# Patient Record
Sex: Male | Born: 1945 | Hispanic: Yes | Marital: Married | Smoking: Never smoker
Health system: Southern US, Community
[De-identification: ages and names within clinical notes are randomized; demographics above are authoritative.]

## PROBLEM LIST (undated history)

## (undated) ENCOUNTER — Emergency Department (HOSPITAL_COMMUNITY): Discharge status: Absent without leave | Payer: BLUE CROSS/BLUE SHIELD

## (undated) DIAGNOSIS — N400 Enlarged prostate without lower urinary tract symptoms: Secondary | ICD-10-CM

## (undated) DIAGNOSIS — J45909 Unspecified asthma, uncomplicated: Secondary | ICD-10-CM

## (undated) DIAGNOSIS — K2970 Gastritis, unspecified, without bleeding: Secondary | ICD-10-CM

## (undated) DIAGNOSIS — E119 Type 2 diabetes mellitus without complications: Secondary | ICD-10-CM

## (undated) HISTORY — PX: HEMORRHOID SURGERY: SHX153

---

## 2006-01-13 ENCOUNTER — Ambulatory Visit: Payer: Self-pay | Admitting: Family Medicine

## 2007-12-23 ENCOUNTER — Ambulatory Visit: Payer: Self-pay | Admitting: Family Medicine

## 2008-06-03 ENCOUNTER — Emergency Department: Payer: Self-pay | Admitting: Emergency Medicine

## 2009-01-16 ENCOUNTER — Emergency Department: Payer: Self-pay | Admitting: Unknown Physician Specialty

## 2011-10-19 ENCOUNTER — Emergency Department: Payer: Self-pay | Admitting: Emergency Medicine

## 2011-10-19 LAB — URINALYSIS, COMPLETE
Bilirubin,UR: NEGATIVE
Blood: NEGATIVE
Nitrite: NEGATIVE
Squamous Epithelial: NONE SEEN

## 2014-10-03 ENCOUNTER — Emergency Department
Admission: EM | Admit: 2014-10-03 | Discharge: 2014-10-03 | Disposition: A | Discharge status: Home / Self Care | Payer: BLUE CROSS/BLUE SHIELD | Attending: Emergency Medicine | Admitting: Emergency Medicine

## 2014-10-03 ENCOUNTER — Encounter: Payer: Self-pay | Admitting: *Deleted

## 2014-10-03 DIAGNOSIS — Z79899 Other long term (current) drug therapy: Secondary | ICD-10-CM | POA: Diagnosis not present

## 2014-10-03 DIAGNOSIS — R059 Cough, unspecified: Secondary | ICD-10-CM

## 2014-10-03 DIAGNOSIS — R05 Cough: Secondary | ICD-10-CM

## 2014-10-03 DIAGNOSIS — J209 Acute bronchitis, unspecified: Secondary | ICD-10-CM | POA: Diagnosis not present

## 2014-10-03 MED ORDER — BENZONATATE 100 MG PO CAPS: 100.0000 mg | ORAL_CAPSULE | Freq: Three times a day (TID) | ORAL | Status: AC | PRN

## 2014-10-03 MED ORDER — FLUTICASONE PROPIONATE 50 MCG/ACT NA SUSP
1.0000 | Freq: Every day | NASAL | Status: AC
Start: 2014-10-03 — End: ?

## 2014-10-03 MED ORDER — AZITHROMYCIN 250 MG PO TABS: ORAL_TABLET | ORAL | Status: AC

## 2014-10-03 NOTE — ED Provider Notes (Signed)
[facility] Emergency Department Provider Note? ____________________________________________ ? Time seen: 1055 ? I have reviewed the triage vital signs and the nursing notes. ________ HISTORY ? Chief Complaint Cough  HPI  Angelia Mouldmado X Kohen is a 69 y.o. male who reports to the ED with complaints of continued cough and sore throat, following visit at the clinic on Wednesday. He was treated in the clinic Motrin but denies any improvement in his symptoms overall. She is here for evaluation and treatment he describes 9 out of 10 pain to the throat, denies any fevers, chills, sweats, nausea, vomiting or productive cough.  Review of Systems  Constitutional: Negative for fever. Eyes: Negative for visual changes. ENT: Positive for sore throat. Cardiovascular: Negative for chest pain. Respiratory: Negative for shortness of breath. Reports cough Gastrointestinal: Negative for abdominal pain, vomiting and diarrhea. Musculoskeletal: Negative for back pain. Skin: Negative for rash. Neurological: Negative for headaches, focal weakness or numbness.  10-point ROS otherwise negative. ____________________________________________  No past medical history on file.  There are no active problems to display for this patient.  ?No past surgical history on file. ? Current Outpatient Rx  Name  Route  Sig  Dispense  Refill  . levothyroxine (SYNTHROID, LEVOTHROID) 112 MCG tablet   Oral   Take 112 mcg by mouth daily before breakfast.         . metFORMIN (GLUCOPHAGE) 1000 MG tablet   Oral   Take 500 mg by mouth 2 (two) times daily with a meal.         . azithromycin (ZITHROMAX Z-PAK) 250 MG tablet      Take 2 tablets (500 mg) on  Day 1,  followed by 1 tablet (250 mg) once daily on Days 2 through 5.   6 each   0   . benzonatate (TESSALON PERLES) 100 MG capsule   Oral   Take 1 capsule (100 mg total) by mouth 3 (three) times daily as needed for cough (Take 1-2 per dose).    30 capsule   0   . fluticasone (FLONASE) 50 MCG/ACT nasal spray   Each Nare   Place 1 spray into both nostrils daily.   16 g   0   ? Allergies Review of patient's allergies indicates no known allergies. ? No family history on file. ? Social History History  Substance Use Topics  . Smoking status: Never Smoker   . Smokeless tobacco: Not on file  . Alcohol Use: No   PHYSICAL EXAM:  VITAL SIGNS: ED Triage Vitals  Enc Vitals Group     BP 10/03/14 0908 116/68 mmHg     Pulse Rate 10/03/14 0908 62     Resp 10/03/14 0908 18     Temp 10/03/14 0908 97.9 F (36.6 C)     Temp Source 10/03/14 0908 Oral     SpO2 10/03/14 0908 99 %     Weight 10/03/14 0908 164 lb (74.39 kg)     Height 10/03/14 0908 5\' 3"  (1.6 m)     Head Cir --      Peak Flow --      Pain Score 10/03/14 0916 9     Pain Loc --      Pain Edu? --      Excl. in GC? --    Constitutional: Alert and oriented. Well appearing and in no distress. Eyes: Conjunctivae are normal. PERRL. Normal extraocular movements. ENT   Head: Normocephalic and atraumatic.   Nose: No congestion/rhinnorhea.   Mouth/Throat:  Mucous membranes are moist.      Ears: Normal external exam. Canals clear. TMs clear bilaterally.   Neck: Supple. No lymphadenopathy. Cardiovascular: Normal rate, regular rhythm. Normal and symmetric distal pulses are present in all extremities. No murmurs, rubs, or gallops. Respiratory: Normal respiratory effort without tachypnea nor retractions. Breath sounds are clear and equal bilaterally. No wheezes/rales/rhonchi. Gastrointestinal: Soft and nontender.  Musculoskeletal: Normal range of motion in all extremities.  Neurologic:  Normal speech and language. No gross focal neurologic deficits are appreciated.  Skin:  Skin is warm, dry and intact. No rash noted. Psychiatric: Mood and affect are normal. Patient exhibits appropriate insight and judgment. ______ PROCEDURES ?  Procedure(s) performed:  None Critical Care performed: None ______________________________________________________ INITIAL IMPRESSION / ASSESSMENT AND PLAN / ED COURSE ? Pertinent labs & imaging results that were available during my care of the patient were reviewed by me and considered in my medical decision making (see chart for details).  ___________________________________________ FINAL CLINICAL IMPRESSION(S) / ED DIAGNOSES?  Final diagnoses:  Cough  Acute bronchitis, unspecified organism      Lissa HoardJenise V Bacon Adelena Desantiago, PA-C 10/03/14 1609  Darien Ramusavid W Kaminski, MD 10/04/14 1504

## 2014-10-03 NOTE — ED Notes (Signed)
Pt complains of cough and throat pain, pt was seen at the clinic Wednesday, pt reports not feeling any better, pt was given Motrin at clinic

## 2014-10-03 NOTE — Discharge Instructions (Signed)
Tos - Adultos  (Cough, Adult)  La tos es un reflejo que ayuda a limpiar las vas areas y Administratorla garganta. Puede ayudar a curar el organismo o ser Neomia Dearuna reaccin a un irritante. La tos puede durar General Electricentre 2  3 semanas (aguda) o puede durar ms de 8 semanas (crnica)  CAUSAS  Tos aguda:   Infecciones virales o bacterianas. Tos crnica.   Infecciones.  Alergias.  Asma.  Goteo post nasal.  El hbito de fumar.  Acidez o reflujo gstrico.  Algunos medicamentos.  Problemas pulmonares crnicos  Cncer. SNTOMAS   Tos.  Grant RutsFiebre.  Dolor en el pecho.  Aumento en el ritmo respiratorio.  Ruidos agudos al respirar (sibilancias).  Moco coloreado al toser (esputo). TRATAMIENTO   Un tos de causa bacteriana puede tratarse con antibiticos.  La tos de origen viral debe seguir su curso y no responde a los antibiticos.  El mdico podr recomendar otros tratamientos si tiene tos crnica. INSTRUCCIONES PARA EL CUIDADO EN EL HOGAR   Solo tome medicamentos que se pueden comprar sin receta o recetados para Chief Technology Officerel dolor, Dentistmalestar o fiebre, como le indica el mdico. Utilice antitusivos slo en la forma indicada por el mdico.  Use un vaporizador o humidificador de niebla fra en la habitacin para ayudar a aflojar las secreciones.  Duerma en posicin semi erguida si la tos empeora por la noche.  Descanse todo lo que pueda.  Si fuma, abandone el hbito. SOLICITE ATENCIN MDICA DE INMEDIATO SI:   Observa pus en el esputo.  La tos empeora.  No puede controlar la tos con antitusivos y no puede dormir debido a Secretary/administratorello.  Comienza a escupir sangre al toser.  Tiene dificultad para respirar.  El dolor empeora o no puede controlarlo con los medicamentos.  Tiene fiebre. ASEGRESE DE QUE:   Comprende estas instrucciones.  Controlar su enfermedad.  Solicitar ayuda de inmediato si no mejora o si empeora. Document Released: 12/17/2010 Document Revised: 08/03/2011 [facility] GroupExitCare Patient  Information 2015 [place]ExitCare, MarylandLLC. This information is not intended to replace advice given to you by your health care provider. Make sure you discuss any questions you have with your health care provider.  Bronquitis aguda (Acute Bronchitis) Se denomina bronquitis cuando las vas respiratorias que van desde la trquea The First Americanhasta los pulmones se irritan, se congestionan y duelen (se inflaman). La bronquitis generalmente produce flema espesa (mucosidad). Esto provoca tos. La tos es el sntoma ms frecuente de la bronquitis. Cuando la bronquitis es [place]aguda, generalmente comienza de [place]manera sbita y desaparece luego de algn tiempo (generalmente en 2 semanas). El hbito de fumar, las alergias y el asma pueden empeorar la bronquitis. Los episodios repetidos de bronquitis pueden causar ms problemas pulmonares. CUIDADOS EN EL HOGAR  Reposo.  Beba abundante cantidad de lquidos para mantener el pis orina) claro o amarillo plido (excepto que debe limitar la ingesta de lquidos por indicacin del mdico).  Tome slo medicamentos de venta libre o recetados, segn las indicaciones del mdico.  Evite fumar o aspirar el humo de otros fumadores. Esto puede empeorar la bronquitis. Si es fumador, considere el uso de chicles o parches en la piel de nicotina. Si deja de fumar, sus pulmones se curarn ms rpido.  Reduzca la probabilidad de enfermarse nuevamente de bronquitis de este modo:  Lvese las manos con frecuencia.  Evite las personas que tengan sntomas de resfro.  Trate de no llevarse las manos a la boca, la nariz o los ojos.  Concurra a las consultas de control con el  mdico, segn las indicaciones. SOLICITE AYUDA SI: Los sntomas no mejoran despus de 1 semana de tratamiento. Los sntomas son:  Leonette Mostos.  Grant RutsFiebre.  Eliminar moco espeso al toser.  Dolores PepsiCoen el cuerpo.  Congestin en el pecho.  Escalofros.  Falta de aire.  Dolor de Advertising copywritergarganta. SOLICITE AYUDA DE INMEDIATO SI:   Le sube la  fiebre.  Tiene escalofros.  Comienza a Surveyor, mineralssentir que le falta el aire de manera preocupante.  Tiene expectoracin con sangre (esputo).  Devuelve (vomita) con frecuencia.  Su organismo pierde mucho lquido (deshidratacin).  Sufre un dolor intenso de Turkmenistancabeza.  Se desmaya. ASEGRESE DE QUE:   Comprende estas instrucciones.  Controlar su afeccin.  Recibir ayuda de inmediato si no mejora o si empeora. Document Released: 01/11/2013 [facility] LLCExitCare Patient Information 2015 [place] VillageExitCare, MarylandLLC. This information is not intended to replace advice given to you by your health care provider. Make sure you discuss any questions you have with your health care provider.

## 2014-10-03 NOTE — ED Notes (Signed)
C/o cough, sorthroat

## 2016-06-02 ENCOUNTER — Other Ambulatory Visit: Payer: Self-pay | Admitting: Family Medicine

## 2016-06-02 DIAGNOSIS — G8929 Other chronic pain: Secondary | ICD-10-CM

## 2016-06-02 DIAGNOSIS — M545 Low back pain: Principal | ICD-10-CM

## 2016-06-09 ENCOUNTER — Ambulatory Visit: Admission: RE | Admit: 2016-06-09 | Payer: BLUE CROSS/BLUE SHIELD | Source: Ambulatory Visit

## 2017-05-17 ENCOUNTER — Other Ambulatory Visit: Payer: Self-pay | Admitting: Family Medicine

## 2017-05-17 DIAGNOSIS — R911 Solitary pulmonary nodule: Secondary | ICD-10-CM

## 2017-06-04 ENCOUNTER — Ambulatory Visit
Admission: RE | Admit: 2017-06-04 | Discharge: 2017-06-04 | Disposition: A | Discharge status: Home / Self Care | Payer: BLUE CROSS/BLUE SHIELD | Source: Ambulatory Visit | Attending: Family Medicine | Admitting: Family Medicine

## 2017-06-04 ENCOUNTER — Ambulatory Visit: Admission: RE | Admit: 2017-06-04 | Payer: BLUE CROSS/BLUE SHIELD | Source: Ambulatory Visit

## 2017-06-04 DIAGNOSIS — J984 Other disorders of lung: Secondary | ICD-10-CM | POA: Diagnosis not present

## 2017-06-04 DIAGNOSIS — R918 Other nonspecific abnormal finding of lung field: Secondary | ICD-10-CM | POA: Diagnosis not present

## 2017-06-04 DIAGNOSIS — R911 Solitary pulmonary nodule: Secondary | ICD-10-CM

## 2017-06-04 DIAGNOSIS — K7689 Other specified diseases of liver: Secondary | ICD-10-CM | POA: Insufficient documentation

## 2017-06-04 DIAGNOSIS — J8410 Pulmonary fibrosis, unspecified: Secondary | ICD-10-CM | POA: Diagnosis not present

## 2018-07-21 ENCOUNTER — Other Ambulatory Visit: Payer: Self-pay

## 2018-07-21 ENCOUNTER — Emergency Department
Admission: EM | Admit: 2018-07-21 | Discharge: 2018-07-21 | Disposition: A | Discharge status: Home / Self Care | Payer: Self-pay | Attending: Emergency Medicine | Admitting: Emergency Medicine

## 2018-07-21 ENCOUNTER — Encounter: Payer: Self-pay | Admitting: Emergency Medicine

## 2018-07-21 ENCOUNTER — Emergency Department: Payer: Self-pay

## 2018-07-21 DIAGNOSIS — Z79899 Other long term (current) drug therapy: Secondary | ICD-10-CM | POA: Insufficient documentation

## 2018-07-21 DIAGNOSIS — Z7984 Long term (current) use of oral hypoglycemic drugs: Secondary | ICD-10-CM | POA: Insufficient documentation

## 2018-07-21 DIAGNOSIS — E119 Type 2 diabetes mellitus without complications: Secondary | ICD-10-CM | POA: Insufficient documentation

## 2018-07-21 DIAGNOSIS — M25561 Pain in right knee: Secondary | ICD-10-CM | POA: Insufficient documentation

## 2018-07-21 HISTORY — DX: Type 2 diabetes mellitus without complications: E11.9
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
## 2018-07-21 NOTE — Discharge Instructions (Addendum)
Advised over-the-counter extra strength Tylenol and follow-up with PCP.
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
## 2018-10-19 ENCOUNTER — Emergency Department: Payer: Self-pay

## 2018-10-19 ENCOUNTER — Emergency Department
Admission: EM | Admit: 2018-10-19 | Discharge: 2018-10-19 | Disposition: A | Discharge status: Home / Self Care | Payer: Self-pay | Attending: Emergency Medicine | Admitting: Emergency Medicine

## 2018-10-19 ENCOUNTER — Other Ambulatory Visit: Payer: Self-pay

## 2018-10-19 DIAGNOSIS — Z79899 Other long term (current) drug therapy: Secondary | ICD-10-CM | POA: Insufficient documentation

## 2018-10-19 DIAGNOSIS — M25561 Pain in right knee: Secondary | ICD-10-CM | POA: Insufficient documentation

## 2018-10-19 DIAGNOSIS — Z7984 Long term (current) use of oral hypoglycemic drugs: Secondary | ICD-10-CM | POA: Insufficient documentation

## 2018-10-19 DIAGNOSIS — E119 Type 2 diabetes mellitus without complications: Secondary | ICD-10-CM | POA: Insufficient documentation

## 2018-10-19 DIAGNOSIS — F329 Major depressive disorder, single episode, unspecified: Secondary | ICD-10-CM | POA: Insufficient documentation

## 2018-10-19 MED ORDER — DICLOFENAC SODIUM 1 % TD GEL: 2.0000 g | Freq: Four times a day (QID) | TRANSDERMAL | 0 refills | Status: AC

## 2018-10-19 MED ORDER — MELOXICAM 7.5 MG PO TABS
7.5000 mg | ORAL_TABLET | Freq: Once | ORAL | Status: AC
  Administered 2018-10-19: 7.5 mg via ORAL
  Filled 2018-10-19: qty 1

## 2018-10-19 NOTE — Discharge Instructions (Addendum)
The x-ray of your knee shows some advanced arthritis.  Please use diclofenac gel to knee for pain.  Keep knee Ace wrap.  Call Dr. Rosita Kea tomorrow for an appointment for reevaluation.  Please establish with primary care.  I have given you a referral [facility] [place].  I have also given you a referral to a counseling service if you are having any trouble with depression.  Please return to the emergency department for worsening symptoms.

## 2018-10-19 NOTE — ED Triage Notes (Signed)
Pt states he injured his right knee at work in [place] and was seen here but is not getting any better. States he has not been able to get an apt for follow up
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
## 2020-02-12 ENCOUNTER — Encounter: Payer: Self-pay | Admitting: Emergency Medicine

## 2020-02-12 ENCOUNTER — Emergency Department: Payer: Self-pay

## 2020-02-12 ENCOUNTER — Other Ambulatory Visit: Payer: Self-pay

## 2020-02-12 ENCOUNTER — Emergency Department
Admission: EM | Admit: 2020-02-12 | Discharge: 2020-02-12 | Disposition: A | Discharge status: Home / Self Care | Payer: Self-pay | Attending: Emergency Medicine | Admitting: Emergency Medicine

## 2020-02-12 DIAGNOSIS — Z20822 Contact with and (suspected) exposure to covid-19: Secondary | ICD-10-CM | POA: Insufficient documentation

## 2020-02-12 DIAGNOSIS — E119 Type 2 diabetes mellitus without complications: Secondary | ICD-10-CM | POA: Insufficient documentation

## 2020-02-12 DIAGNOSIS — R0602 Shortness of breath: Secondary | ICD-10-CM | POA: Insufficient documentation

## 2020-02-12 DIAGNOSIS — J45909 Unspecified asthma, uncomplicated: Secondary | ICD-10-CM | POA: Insufficient documentation

## 2020-02-12 DIAGNOSIS — R0789 Other chest pain: Secondary | ICD-10-CM | POA: Insufficient documentation

## 2020-02-12 DIAGNOSIS — Z7951 Long term (current) use of inhaled steroids: Secondary | ICD-10-CM | POA: Insufficient documentation

## 2020-02-12 DIAGNOSIS — Z7984 Long term (current) use of oral hypoglycemic drugs: Secondary | ICD-10-CM | POA: Insufficient documentation

## 2020-02-12 DIAGNOSIS — R079 Chest pain, unspecified: Secondary | ICD-10-CM

## 2020-02-12 HISTORY — DX: Unspecified asthma, uncomplicated: J45.909

## 2020-02-12 HISTORY — DX: Benign prostatic hyperplasia without lower urinary tract symptoms: N40.0

## 2020-02-12 HISTORY — DX: Gastritis, unspecified, without bleeding: K29.70

## 2020-02-12 LAB — SARS CORONAVIRUS 2 BY RT PCR (HOSPITAL ORDER, PERFORMED IN ~~LOC~~ HOSPITAL LAB): SARS Coronavirus 2: NEGATIVE

## 2020-02-12 LAB — TROPONIN I (HIGH SENSITIVITY)
Troponin I (High Sensitivity): 3 ng/L (ref <18)
Troponin I (High Sensitivity): 3 ng/L (ref <18)

## 2020-02-12 LAB — BASIC METABOLIC PANEL
Anion gap: 8 (ref 5–15)
BUN: 23 mg/dL (ref 8–23)
CO2: 23 mmol/L (ref 22–32)
Calcium: 9.5 mg/dL (ref 8.9–10.3)
Chloride: 103 mmol/L (ref 98–111)
Creatinine, Ser: 0.72 mg/dL (ref 0.61–1.24)
GFR calc Af Amer: >60 mL/min (ref >60)
GFR calc non Af Amer: >60 mL/min (ref >60)
Glucose, Bld: 132 mg/dL — ABNORMAL HIGH (ref 70–99)
Potassium: 4 mmol/L (ref 3.5–5.1)
Sodium: 134 mmol/L — ABNORMAL LOW (ref 135–145)

## 2020-02-12 LAB — CBC
HCT: 42.4 % (ref 39.0–52.0)
Hemoglobin: 14 g/dL (ref 13.0–17.0)
MCH: 31.1 pg (ref 26.0–34.0)
MCHC: 33 g/dL (ref 30.0–36.0)
MCV: 94.2 fL (ref 80.0–100.0)
Platelets: 233 10*3/uL (ref 150–400)
RBC: 4.5 MIL/uL (ref 4.22–5.81)
RDW: 14.2 % (ref 11.5–15.5)
WBC: 6.3 10*3/uL (ref 4.0–10.5)
nRBC: 0 % (ref 0.0–0.2)

## 2020-02-12 NOTE — ED Provider Notes (Signed)
[facility] Emergency Department Provider Note   ____________________________________________   First MD Initiated Contact with Patient 02/12/20 1810     (approximate)  I have reviewed the triage vital signs and the nursing notes.   HISTORY  Chief Complaint Chest Pain    HPI Mason Gutierrez is a 74 y.o. male with possible history of diabetes and asthma who presents to the ED complaining of chest pain and shortness of breath.  History is limited as patient is Spanish-speaking only, history obtained via interpreter [phone].  Patient reports intermittent pressure in the left side of his chest along with some mild difficulty breathing over the past couple of days.  Symptoms are not exacerbated or alleviated by anything in particular, but patient states he feels like he does not have the energy to take a full breath.  He denies any fevers or cough, but has been dealing with a headache as well.  He denies any pain or swelling in his legs.  He is not aware of any sick contacts.  He currently reports minimal chest pain or shortness of breath.        Past Medical History:  Diagnosis Date  . Asthma   . BPH (benign prostatic hyperplasia)   . Diabetes mellitus without complication (HCC)   . Gastritis     There are no problems to display for this patient.   Past Surgical History:  Procedure Laterality Date  . HEMORRHOID SURGERY      Prior to Admission medications   Medication Sig Start Date End Date Taking? Authorizing Provider  azithromycin (ZITHROMAX Z-PAK) 250 MG tablet Take 2 tablets (500 mg) on  Day 1,  followed by 1 tablet (250 mg) once daily on Days 2 through 5. 10/03/14   Menshew, Charlesetta Ivory, PA-C  benzonatate (TESSALON PERLES) 100 MG capsule Take 1 capsule (100 mg total) by mouth 3 (three) times daily as needed for cough (Take 1-2 per dose). 10/03/14   Menshew, Charlesetta Ivory, PA-C  diclofenac sodium (VOLTAREN) 1 % GEL Apply 2 g topically 4 (four)  times daily. 10/19/18   Enid Derry, PA-C  fluticasone (FLONASE) 50 MCG/ACT nasal spray Place 1 spray into both nostrils daily. 10/03/14   Menshew, Charlesetta Ivory, PA-C  levothyroxine (SYNTHROID, LEVOTHROID) 112 MCG tablet Take 112 mcg by mouth daily before breakfast.    [provider]  metFORMIN (GLUCOPHAGE) 1000 MG tablet Take 500 mg by mouth 2 (two) times daily with a meal.    [provider]    Allergies Patient has no known allergies.  History reviewed. No pertinent family history.  Social History Social History   Tobacco Use  . Smoking status: Never Smoker  . Smokeless tobacco: Never Used  Substance Use Topics  . Alcohol use: No  . Drug use: No    Review of Systems  Constitutional: No fever/chills Eyes: No visual changes. ENT: No sore throat. Cardiovascular: Positive for chest pain. Respiratory: Positive for shortness of breath. Gastrointestinal: No abdominal pain.  No nausea, no vomiting.  No diarrhea.  No constipation. Genitourinary: Negative for dysuria. Musculoskeletal: Negative for back pain. Skin: Negative for rash. Neurological: Positive for headaches, negative for focal weakness or numbness.  ____________________________________________   PHYSICAL EXAM:  VITAL SIGNS: ED Triage Vitals  Enc Vitals Group     BP 02/12/20 1422 120/68     Pulse Rate 02/12/20 1422 63     Resp 02/12/20 1422 16     Temp 02/12/20 1431  97.9 F (36.6 C)     Temp Source 02/12/20 1431 Oral     SpO2 02/12/20 1422 98 %     Weight 02/12/20 1425 166 lb (75.3 kg)     Height 02/12/20 1425 5' 5.35" (1.66 m)     Head Circumference --      Peak Flow --      Pain Score 02/12/20 1423 7     Pain Loc --      Pain Edu? --      Excl. in GC? --     Constitutional: Alert and oriented. Eyes: Conjunctivae are normal. Head: Atraumatic. Nose: No congestion/rhinnorhea. Mouth/Throat: Mucous membranes are moist. Neck: Normal ROM Cardiovascular: Normal rate, regular  rhythm. Grossly normal heart sounds. Respiratory: Normal respiratory effort.  No retractions. Lungs CTAB. Gastrointestinal: Soft and nontender. No distention. Genitourinary: deferred Musculoskeletal: No lower extremity tenderness nor edema. Neurologic:  Normal speech and language. No gross focal neurologic deficits are appreciated. Skin:  Skin is warm, dry and intact. No rash noted. Psychiatric: Mood and affect are normal. Speech and behavior are normal.  ____________________________________________   LABS (all labs ordered are listed, but only abnormal results are displayed)  Labs Reviewed  BASIC METABOLIC PANEL - Abnormal; Notable for the following components:      Result Value   Sodium 134 (*)    Glucose, Bld 132 (*)    All other components within normal limits  SARS CORONAVIRUS 2 BY RT PCR (HOSPITAL ORDER, PERFORMED IN [place] [facility])  CBC  TROPONIN I (HIGH SENSITIVITY)  TROPONIN I (HIGH SENSITIVITY)   ____________________________________________  EKG  ED ECG REPORT I, Chesley Noon, the attending physician, personally viewed and interpreted this ECG.   Date: 02/12/2020  EKG Time: 14:21  Rate: 58  Rhythm: sinus bradycardia  Axis: Normal  Intervals:none  ST&T Change: None   PROCEDURES  Procedure(s) performed (including Critical Care):  Procedures   ____________________________________________   INITIAL IMPRESSION / ASSESSMENT AND PLAN / ED COURSE       74 year old male with past medical history of diabetes and asthma who presents to the ED complaining of intermittent chest pain and shortness of breath over the past few days.  Low suspicion for ACS as EKG is unremarkable and initial troponin negative.  We will repeat second set troponin, but if this is negative I doubt ACS and he would be appropriate for discharge given heart score of less than 4.  Chest x-ray is unremarkable and I have a low suspicion for PE or dissection.  Given his associated  headache, viral illness is possible and we will test for COVID-19.  Do not suspect intracranial process given gradually worsening headache with no focal neurologic deficits on exam.  Repeat troponin within normal limits and patient remains chest pain-free here in the ED.  He is appropriate for discharge home and was given referral to follow-up with cardiology.  He was counseled to return to the ED for new worsening symptoms, patient agrees with plan.      ____________________________________________   FINAL CLINICAL IMPRESSION(S) / ED DIAGNOSES  Final diagnoses:  Nonspecific chest pain     ED Discharge Orders    None       Note:  This document was prepared using Dragon voice recognition software and may include unintentional dictation errors.   Chesley Noon, MD 02/12/20 2026
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
## 2020-07-29 ENCOUNTER — Emergency Department: Payer: Self-pay

## 2020-07-29 ENCOUNTER — Other Ambulatory Visit: Payer: Self-pay

## 2020-07-29 ENCOUNTER — Emergency Department
Admission: EM | Admit: 2020-07-29 | Discharge: 2020-07-30 | Disposition: A | Discharge status: Home / Self Care | Payer: Self-pay | Attending: Emergency Medicine | Admitting: Emergency Medicine

## 2020-07-29 ENCOUNTER — Encounter: Payer: Self-pay | Admitting: Radiology

## 2020-07-29 DIAGNOSIS — E119 Type 2 diabetes mellitus without complications: Secondary | ICD-10-CM | POA: Insufficient documentation

## 2020-07-29 DIAGNOSIS — Y92410 Unspecified street and highway as the place of occurrence of the external cause: Secondary | ICD-10-CM | POA: Diagnosis not present

## 2020-07-29 DIAGNOSIS — M25512 Pain in left shoulder: Secondary | ICD-10-CM | POA: Diagnosis not present

## 2020-07-29 DIAGNOSIS — J45909 Unspecified asthma, uncomplicated: Secondary | ICD-10-CM | POA: Insufficient documentation

## 2020-07-29 DIAGNOSIS — Z7984 Long term (current) use of oral hypoglycemic drugs: Secondary | ICD-10-CM | POA: Insufficient documentation

## 2020-07-29 DIAGNOSIS — F32A Depression, unspecified: Secondary | ICD-10-CM

## 2020-07-29 LAB — URINE DRUG SCREEN, QUALITATIVE (ARMC ONLY)
Amphetamines, Ur Screen: NOT DETECTED
Barbiturates, Ur Screen: NOT DETECTED
Benzodiazepine, Ur Scrn: NOT DETECTED
Cannabinoid 50 Ng, Ur ~~LOC~~: NOT DETECTED
Cocaine Metabolite,Ur ~~LOC~~: NOT DETECTED
MDMA (Ecstasy)Ur Screen: NOT DETECTED
Methadone Scn, Ur: NOT DETECTED
Opiate, Ur Screen: NOT DETECTED
Phencyclidine (PCP) Ur S: NOT DETECTED
Tricyclic, Ur Screen: NOT DETECTED

## 2020-07-29 LAB — CBC WITH DIFFERENTIAL/PLATELET
Abs Immature Granulocytes: 0.01 10*3/uL (ref 0.00–0.07)
Basophils Absolute: 0.1 10*3/uL (ref 0.0–0.1)
Basophils Relative: 1 %
Eosinophils Absolute: 2 10*3/uL — ABNORMAL HIGH (ref 0.0–0.5)
Eosinophils Relative: 27 %
HCT: 41.2 % (ref 39.0–52.0)
Hemoglobin: 13.6 g/dL (ref 13.0–17.0)
Immature Granulocytes: 0 %
Lymphocytes Relative: 35 %
Lymphs Abs: 2.7 10*3/uL (ref 0.7–4.0)
MCH: 30.2 pg (ref 26.0–34.0)
MCHC: 33 g/dL (ref 30.0–36.0)
MCV: 91.6 fL (ref 80.0–100.0)
Monocytes Absolute: 0.7 10*3/uL (ref 0.1–1.0)
Monocytes Relative: 9 %
Neutro Abs: 2.1 10*3/uL (ref 1.7–7.7)
Neutrophils Relative %: 28 %
Platelets: 232 10*3/uL (ref 150–400)
RBC: 4.5 MIL/uL (ref 4.22–5.81)
RDW: 12.7 % (ref 11.5–15.5)
WBC: 7.6 10*3/uL (ref 4.0–10.5)
nRBC: 0 % (ref 0.0–0.2)

## 2020-07-29 LAB — SALICYLATE LEVEL: Salicylate Lvl: <7 mg/dL — ABNORMAL LOW (ref 7.0–30.0)

## 2020-07-29 LAB — URINALYSIS, COMPLETE (UACMP) WITH MICROSCOPIC
Bacteria, UA: NONE SEEN
Bilirubin Urine: NEGATIVE
Glucose, UA: NEGATIVE mg/dL
Hgb urine dipstick: NEGATIVE
Ketones, ur: NEGATIVE mg/dL
Leukocytes,Ua: NEGATIVE
Nitrite: NEGATIVE
Protein, ur: NEGATIVE mg/dL
Specific Gravity, Urine: 1.027 (ref 1.005–1.030)
pH: 5 (ref 5.0–8.0)

## 2020-07-29 LAB — COMPREHENSIVE METABOLIC PANEL
ALT: 19 U/L (ref 0–44)
AST: 20 U/L (ref 15–41)
Albumin: 4.2 g/dL (ref 3.5–5.0)
Alkaline Phosphatase: 60 U/L (ref 38–126)
Anion gap: 7 (ref 5–15)
BUN: 19 mg/dL (ref 8–23)
CO2: 25 mmol/L (ref 22–32)
Calcium: 9.4 mg/dL (ref 8.9–10.3)
Chloride: 102 mmol/L (ref 98–111)
Creatinine, Ser: 0.8 mg/dL (ref 0.61–1.24)
GFR, Estimated: >60 mL/min (ref >60)
Glucose, Bld: 181 mg/dL — ABNORMAL HIGH (ref 70–99)
Potassium: 4 mmol/L (ref 3.5–5.1)
Sodium: 134 mmol/L — ABNORMAL LOW (ref 135–145)
Total Bilirubin: 0.8 mg/dL (ref 0.3–1.2)
Total Protein: 7.4 g/dL (ref 6.5–8.1)

## 2020-07-29 LAB — ETHANOL: Alcohol, Ethyl (B): <10 mg/dL (ref <10)

## 2020-07-29 LAB — ACETAMINOPHEN LEVEL: Acetaminophen (Tylenol), Serum: <10 ug/mL — ABNORMAL LOW (ref 10–30)

## 2020-07-29 NOTE — ED Notes (Signed)
Belongings:White men's t shirt, white striped button down shirt, brown leather belt, gray pants, brown shoes, 1 pair, blue baseball cap, grocery bag with papers, camo wallet, small black notebook, loose change, car keys, gray sweatpants, gray socks, blue briefs,  All belongings placed in bag and labeled with pt's information.  Pt states,through interpreter, that he does not want to hurt himself or others but often wants to cry and feels desperate. Pt informed of procedure to move to room 20 in quad.  Pt dressed in blue paper scrubs, white stretchy briefs and nonskid socks.
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
## 2020-07-30 DIAGNOSIS — F32A Depression, unspecified: Secondary | ICD-10-CM
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
## 2020-07-30 NOTE — ED Notes (Signed)
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
## 2020-07-30 NOTE — ED Notes (Signed)
Psych NP and TTS at bedside 

## 2022-05-23 IMAGING — CR DG CHEST 2V
1 series · 2 of 2 positions shown · non-contrast
Comparison: January 13, 2006

CLINICAL DATA: Chest pain and shortness of breath

EXAM:
CHEST - 2 VIEW

[Series 1: dg chest 2 view · 0.14mm/px · 2 of 2 slices shown]
[im 1/2]
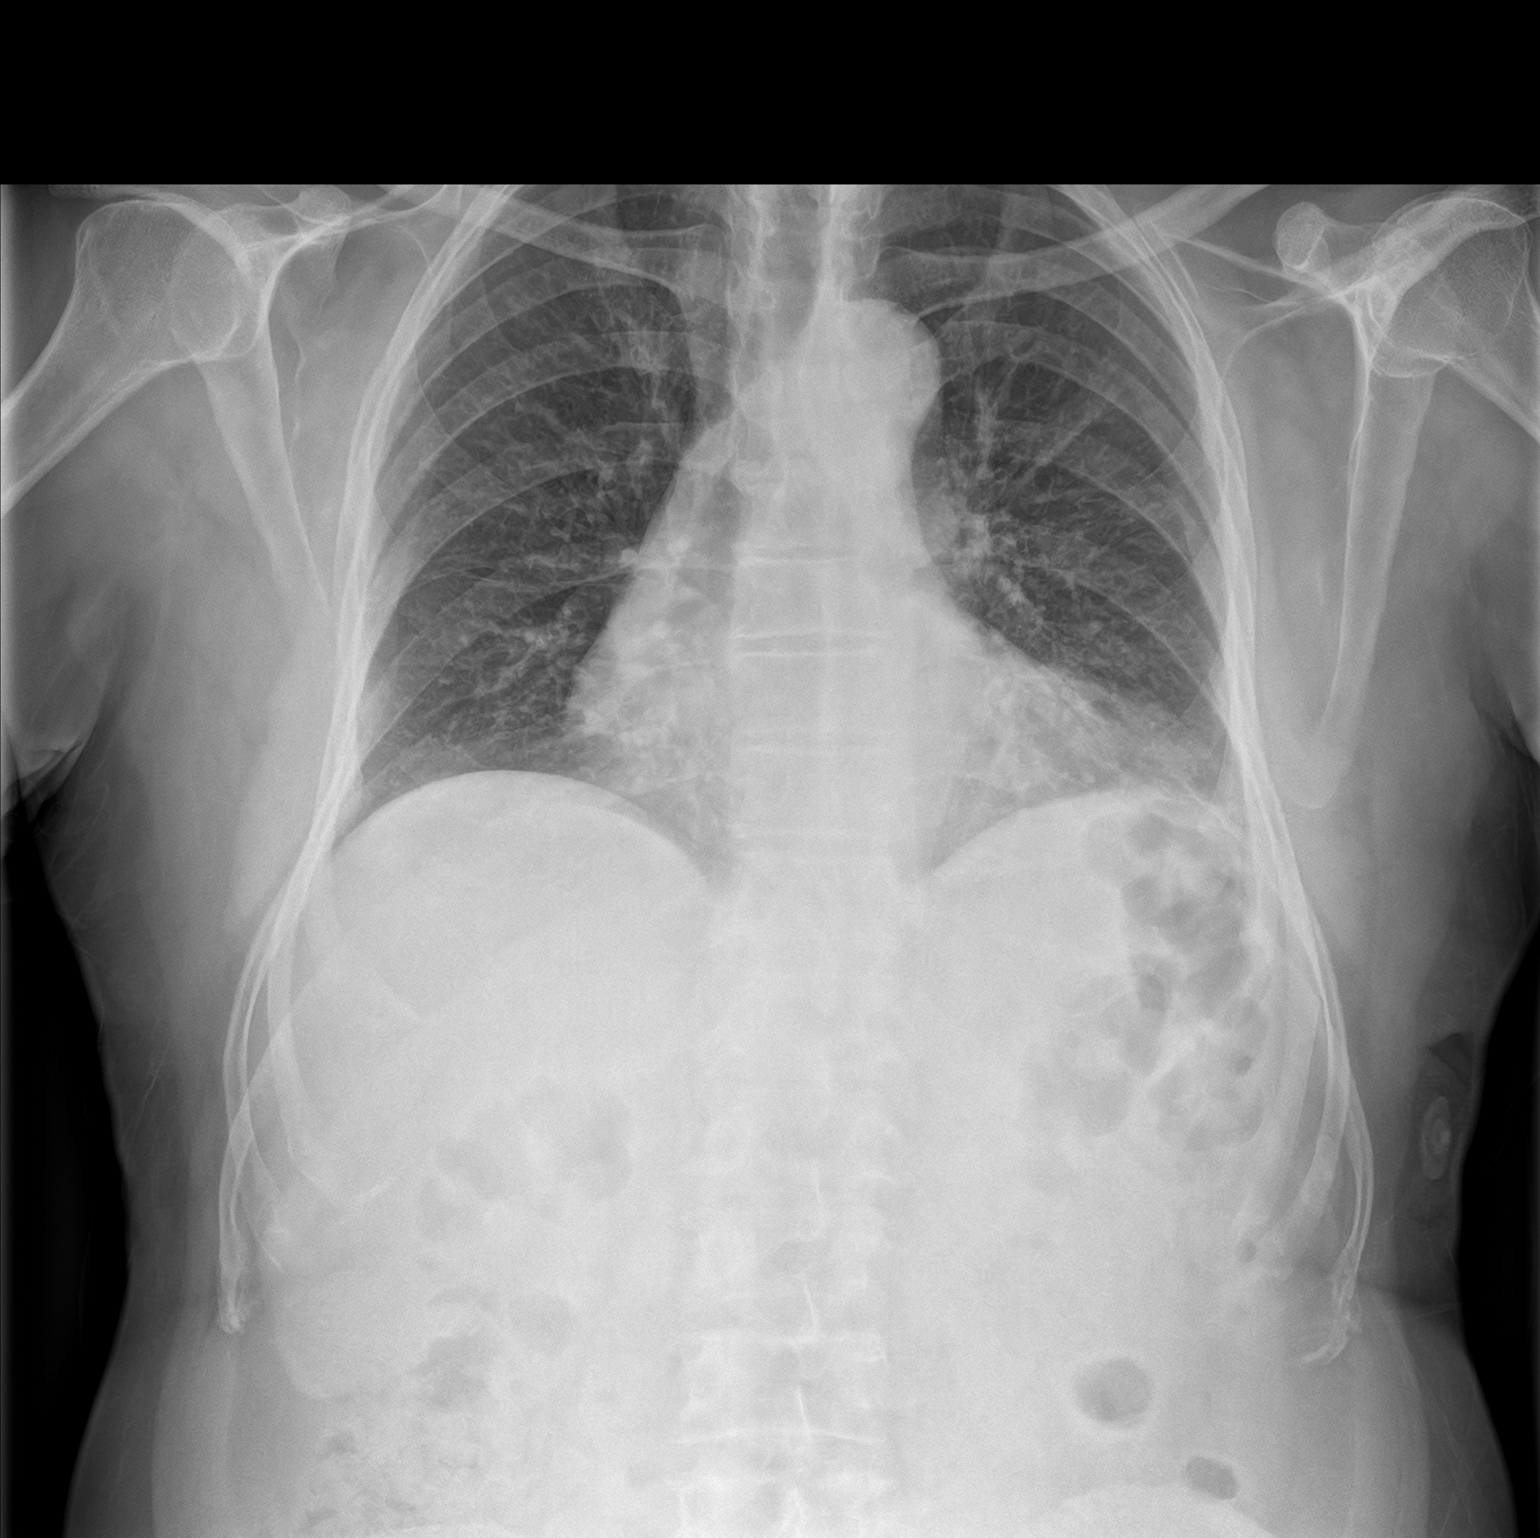
[im 2/2]
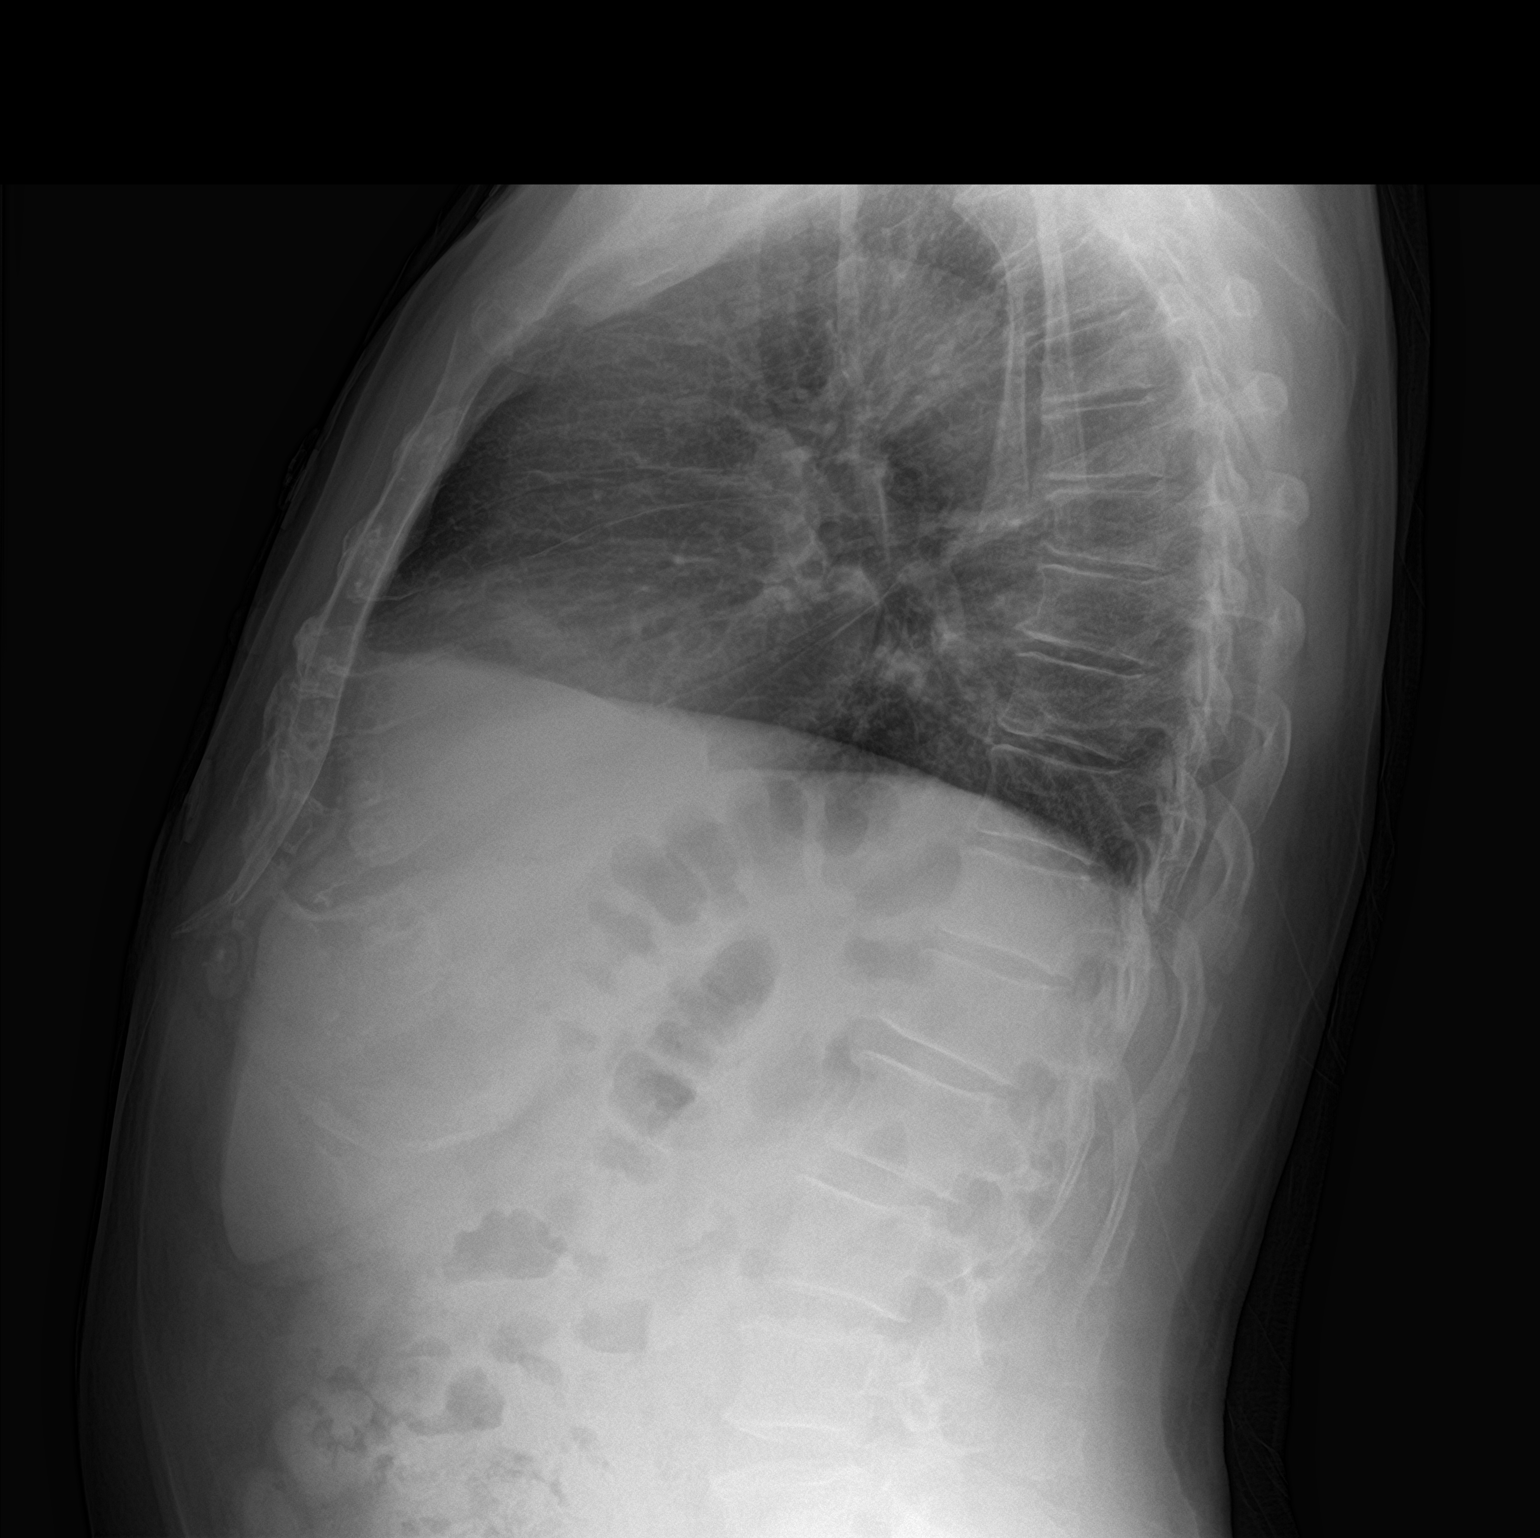

[2 of 2 positions shown; findings below may reference images not displayed]

FINDINGS: There is mild left base atelectasis. The lungs elsewhere are clear.
Heart size and pulmonary vascularity are normal. No adenopathy. No
bone lesions. No pneumothorax.
IMPRESSION: Mild left base atelectasis. Lungs elsewhere clear. Cardiac
silhouette within normal limits.
